# Patient Record
Sex: Female | Born: 2005 | Race: White | Hispanic: No | Marital: Single | Smoking: Never smoker
Health system: Southern US, Community
[De-identification: ages and names within clinical notes are randomized; demographics above are authoritative.]

---
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
## 2024-01-20 DIAGNOSIS — Z30016 Encounter for initial prescription of transdermal patch hormonal contraceptive device: Secondary | ICD-10-CM | POA: Diagnosis not present

## 2024-01-20 DIAGNOSIS — B3731 Acute candidiasis of vulva and vagina: Secondary | ICD-10-CM | POA: Diagnosis not present

## 2024-01-20 DIAGNOSIS — Z0000 Encounter for general adult medical examination without abnormal findings: Secondary | ICD-10-CM | POA: Diagnosis not present
